# Patient Record
Sex: Male | Born: 1985 | Race: White | Hispanic: No | Marital: Single | Smoking: Never smoker
Health system: Southern US, Community
[De-identification: ages and names within clinical notes are randomized; demographics above are authoritative.]

## PROBLEM LIST (undated history)

## (undated) DIAGNOSIS — J302 Other seasonal allergic rhinitis: Secondary | ICD-10-CM

## (undated) DIAGNOSIS — K219 Gastro-esophageal reflux disease without esophagitis: Secondary | ICD-10-CM

## (undated) HISTORY — PX: TONSILLECTOMY: SUR1361

---

## 2019-07-12 ENCOUNTER — Emergency Department: Payer: 59

## 2019-07-12 ENCOUNTER — Other Ambulatory Visit: Payer: Self-pay

## 2019-07-12 ENCOUNTER — Encounter: Payer: Self-pay | Admitting: *Deleted

## 2019-07-12 ENCOUNTER — Emergency Department
Admission: EM | Admit: 2019-07-12 | Discharge: 2019-07-12 | Disposition: A | Discharge status: Home / Self Care | Payer: 59 | Attending: Emergency Medicine | Admitting: Emergency Medicine

## 2019-07-12 DIAGNOSIS — Y999 Unspecified external cause status: Secondary | ICD-10-CM | POA: Insufficient documentation

## 2019-07-12 DIAGNOSIS — Q7192 Unspecified reduction defect of left upper limb: Secondary | ICD-10-CM

## 2019-07-12 DIAGNOSIS — Y9351 Activity, roller skating (inline) and skateboarding: Secondary | ICD-10-CM | POA: Diagnosis not present

## 2019-07-12 DIAGNOSIS — Y929 Unspecified place or not applicable: Secondary | ICD-10-CM | POA: Insufficient documentation

## 2019-07-12 DIAGNOSIS — S52552A Other extraarticular fracture of lower end of left radius, initial encounter for closed fracture: Secondary | ICD-10-CM

## 2019-07-12 DIAGNOSIS — W19XXXA Unspecified fall, initial encounter: Secondary | ICD-10-CM

## 2019-07-12 DIAGNOSIS — S59912A Unspecified injury of left forearm, initial encounter: Secondary | ICD-10-CM | POA: Diagnosis present

## 2019-07-12 HISTORY — DX: Gastro-esophageal reflux disease without esophagitis: K21.9

## 2019-07-12 HISTORY — DX: Other seasonal allergic rhinitis: J30.2

## 2019-07-12 MED ORDER — DIAZEPAM 5 MG PO TABS
5.0000 mg | ORAL_TABLET | Freq: Once | ORAL | Status: AC
  Administered 2019-07-12: 5 mg via ORAL
  Filled 2019-07-12: qty 1

## 2019-07-12 MED ORDER — LIDOCAINE HCL (PF) 1 % IJ SOLN
10.0000 mL | Freq: Once | INTRAMUSCULAR | Status: AC
  Administered 2019-07-12: 10 mL
  Filled 2019-07-12: qty 10

## 2019-07-12 MED ORDER — KETOROLAC TROMETHAMINE 10 MG PO TABS: 10.0000 mg | ORAL_TABLET | Freq: Three times a day (TID) | ORAL | 0 refills | Status: AC

## 2019-07-12 MED ORDER — ONDANSETRON HCL 4 MG/2ML IJ SOLN
4.0000 mg | Freq: Once | INTRAMUSCULAR | Status: AC
  Administered 2019-07-12: 4 mg via INTRAVENOUS

## 2019-07-12 MED ORDER — HYDROCODONE-ACETAMINOPHEN 5-325 MG PO TABS: 1.0000 | ORAL_TABLET | Freq: Three times a day (TID) | ORAL | 0 refills | Status: AC | PRN

## 2019-07-12 MED ORDER — KETOROLAC TROMETHAMINE 30 MG/ML IJ SOLN
30.0000 mg | Freq: Once | INTRAMUSCULAR | Status: AC
  Administered 2019-07-12: 30 mg via INTRAVENOUS
  Filled 2019-07-12: qty 1

## 2019-07-12 MED ORDER — ONDANSETRON HCL 4 MG/2ML IJ SOLN
INTRAMUSCULAR | Status: AC
  Filled 2019-07-12: qty 2

## 2019-07-12 MED ORDER — HYDROMORPHONE HCL 1 MG/ML IJ SOLN
1.0000 mg | Freq: Once | INTRAMUSCULAR | Status: AC
  Administered 2019-07-12: 1 mg via INTRAVENOUS
  Filled 2019-07-12: qty 1

## 2019-07-12 NOTE — ED Provider Notes (Addendum)
[facility] Emergency Department Provider Note ____________________________________________  Time seen: 2100  I have reviewed the triage vital signs and the nursing notes.  HISTORY  Chief Complaint  Arm Injury   HPI Donald Morrow is a 34 y.o. male presents to the clinic today with c/o left wrist pain, swelling and disfigurement. He reports he was roller skating, started to fall and tried to catch himself with his left hand. He reports immediate sharp, stabbing, throbbing pain 10/10. He denies numbness or tingling in the left hand.   Past Medical History:  Diagnosis Date  . Acid reflux   . Seasonal allergies     There are no problems to display for this patient.    Prior to Admission medications   Medication Sig Start Date End Date Taking? Authorizing Provider  HYDROcodone-acetaminophen (NORCO/VICODIN) 5-325 MG tablet Take 1 tablet by mouth every 8 (eight) hours as needed for moderate pain. 07/12/19   Jearld Fenton, NP  ketorolac (TORADOL) 10 MG tablet Take 1 tablet (10 mg total) by mouth every 8 (eight) hours. 07/12/19   Jearld Fenton, NP    Allergies Patient has no known allergies.  No family history on file.  Social History Social History   Tobacco Use  . Smoking status: Never Smoker  . Smokeless tobacco: Never Used  Substance Use Topics  . Alcohol use: Yes    Comment: rarely, ETOH tonight  . 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 Cardiovascular: Negative for chest pain or chest tightness. Respiratory: Negative for cough or shortness of breath. Musculoskeletal: Positive for left wrist pain and swelling.  Skin: Negative for redness, abrasions or lacerations. Neurological: Negative for tingling or numbness. ____________________________________________  PHYSICAL EXAM:  VITAL SIGNS: ED Triage Vitals  Enc Vitals Group     BP 07/12/19 2036 124/88      Pulse Rate 07/12/19 2036 (!) 112     Resp 07/12/19 2036 (!) 22     Temp 07/12/19 2036 100.1 F (37.8 C)     Temp Source 07/12/19 2036 Oral     SpO2 07/12/19 2036 96 %     Weight 07/12/19 2037 210 lb (95.3 kg)     Height 07/12/19 2037 5\' 7"  (1.702 m)     Head Circumference --      Peak Flow --      Pain Score 07/12/19 2036 8     Pain Loc --      Pain Edu? --      Excl. in [place]? --     Constitutional: Alert and oriented. Obese, appears in pain. Head: Normocephalic and atraumatic. Eyes: Conjunctivae are normal. PERRL. Normal extraocular movements Cardiovascular: Tachycardic. Radial pulse 2+ bilaterally. Respiratory: Tachypneic. No wheezes/rales/rhonchi. Musculoskeletal: Obvious deformity of left wrist. Neurologic:  Sensation intact to left hand.  Skin:  Skin is warm, dry and intact.  ____________________________________________   RADIOLOGY  Imaging Orders     DG Forearm Left     DG Wrist Complete Left     DG Wrist 2 Views Left  ____________________________________________  PROCEDURES  Reduction of fracture  Date/Time: 07/12/2019 10:42 PM Performed by: Melvenia Needles, PA-C Authorized by: Jearld Fenton, NP  Consent: Verbal consent obtained. Consent given by: patient Patient understanding: patient states understanding of the procedure being performed Patient identity confirmed: verbally with patient Local anesthesia used: yes Anesthesia: hematoma block  Anesthesia: Local  anesthesia used: yes Local Anesthetic: lidocaine 1% without epinephrine Anesthetic total: 7 mL  Sedation: Patient sedated: no  Patient tolerance: patient tolerated the procedure well with no immediate complications    ____________________________________________  INITIAL IMPRESSION / ASSESSMENT AND PLAN / ED COURSE  Distal Comminuted Displaced Radial Fracture, Left:  Xray wrist Xray left forearm 1 mg Dilaudid IV x 1 Zofran 4 mg IV x 1 Toradol 30 mg IV x 1 Valium 5 mg PO x  1 D/w Dr. Franco Collet- ortho on call. Recommend reduction, splinting and follow up with ortho as an outpatient Post reduction xray wrist obtained- reduction successful RX for Toradol 10 mg PO TID with food RX for Hydrocodone 5-325 mg TID prn Placed in sling Follow up with ortho as outpatient     I reviewed the patient's prescription history over the last 12 months in the multi-state controlled substances database(s) that includes [place], [place], [place], [place], [place], [place], [place], [place], [place], [place], [place], [place], [place], and [place].  Results were notable for no recent controlled substances. ____________________________________________  FINAL CLINICAL IMPRESSION(S) / ED DIAGNOSES  Final diagnoses:  Reduction defect of left upper extremity  Fall, initial encounter  Other closed extra-articular fracture of distal end of left radius, initial encounter   Nicki Reaper, NP    Lorre Munroe, NP 07/12/19 2251    Lorre Munroe, NP 07/12/19 2259    Sharman Cheek, MD 07/12/19 2350

## 2019-07-12 NOTE — ED Notes (Signed)
Ice applied along side the left forearm. Patient couldn't tolerate it on the injury. Patient is calmer after the Dilaudid, though still diaphorectic. Patient is no longer swearing or yelling. Patient did not tolerate x-rays well.

## 2019-07-12 NOTE — ED Triage Notes (Signed)
Pt presents w/ L wrist/forearm injury that is displaced. Pt skating and fell catching self w/ L hand.

## 2019-07-12 NOTE — Discharge Instructions (Addendum)
You were seen today for left distal radius fracture. Your fracture was reduced and splinted. I have given you a RX for antiinflammatories and pain medication. I need you to follow up with ortho, please call to schedule this appt.

## 2021-10-03 IMAGING — DX DG WRIST 2V*L*
2 series · 2 of 2 positions shown · non-contrast
Comparison: 07/12/2019

CLINICAL DATA: Postreduction

EXAM:
LEFT WRIST - 2 VIEW

[wrist ap]
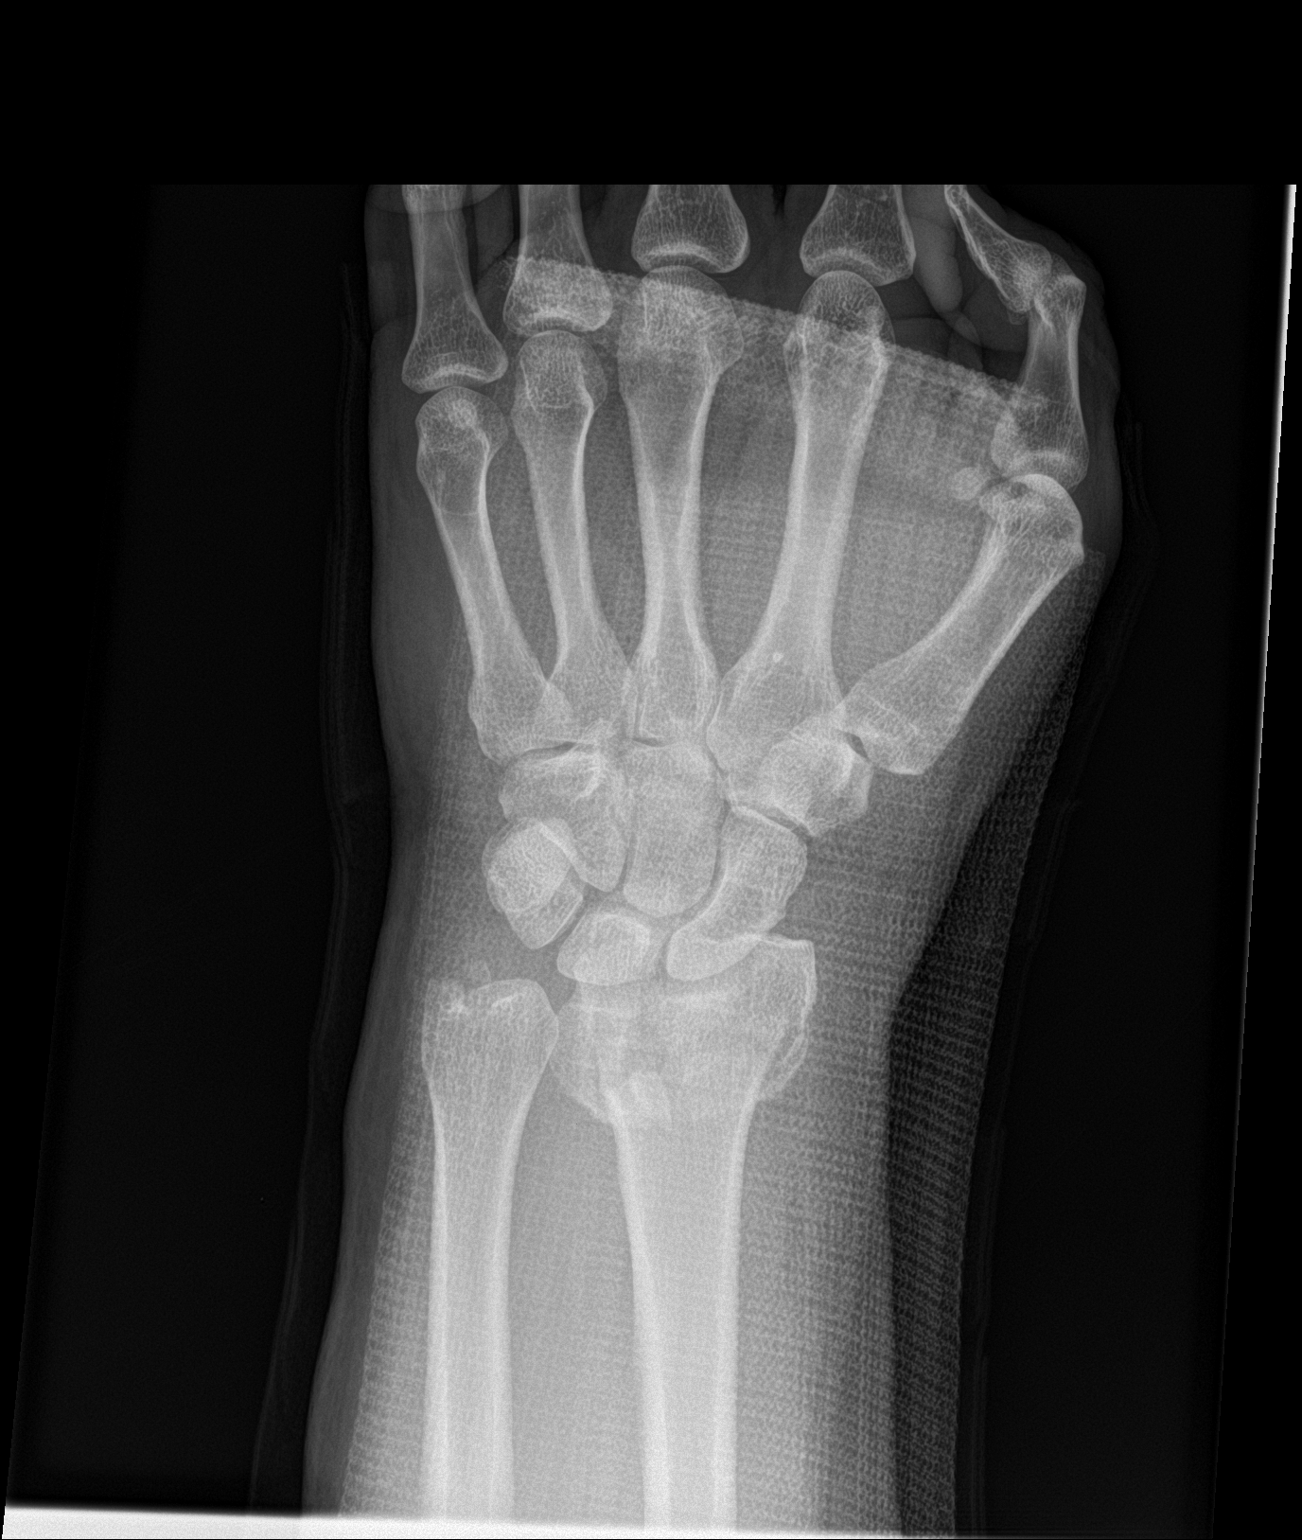

[wrist lat]
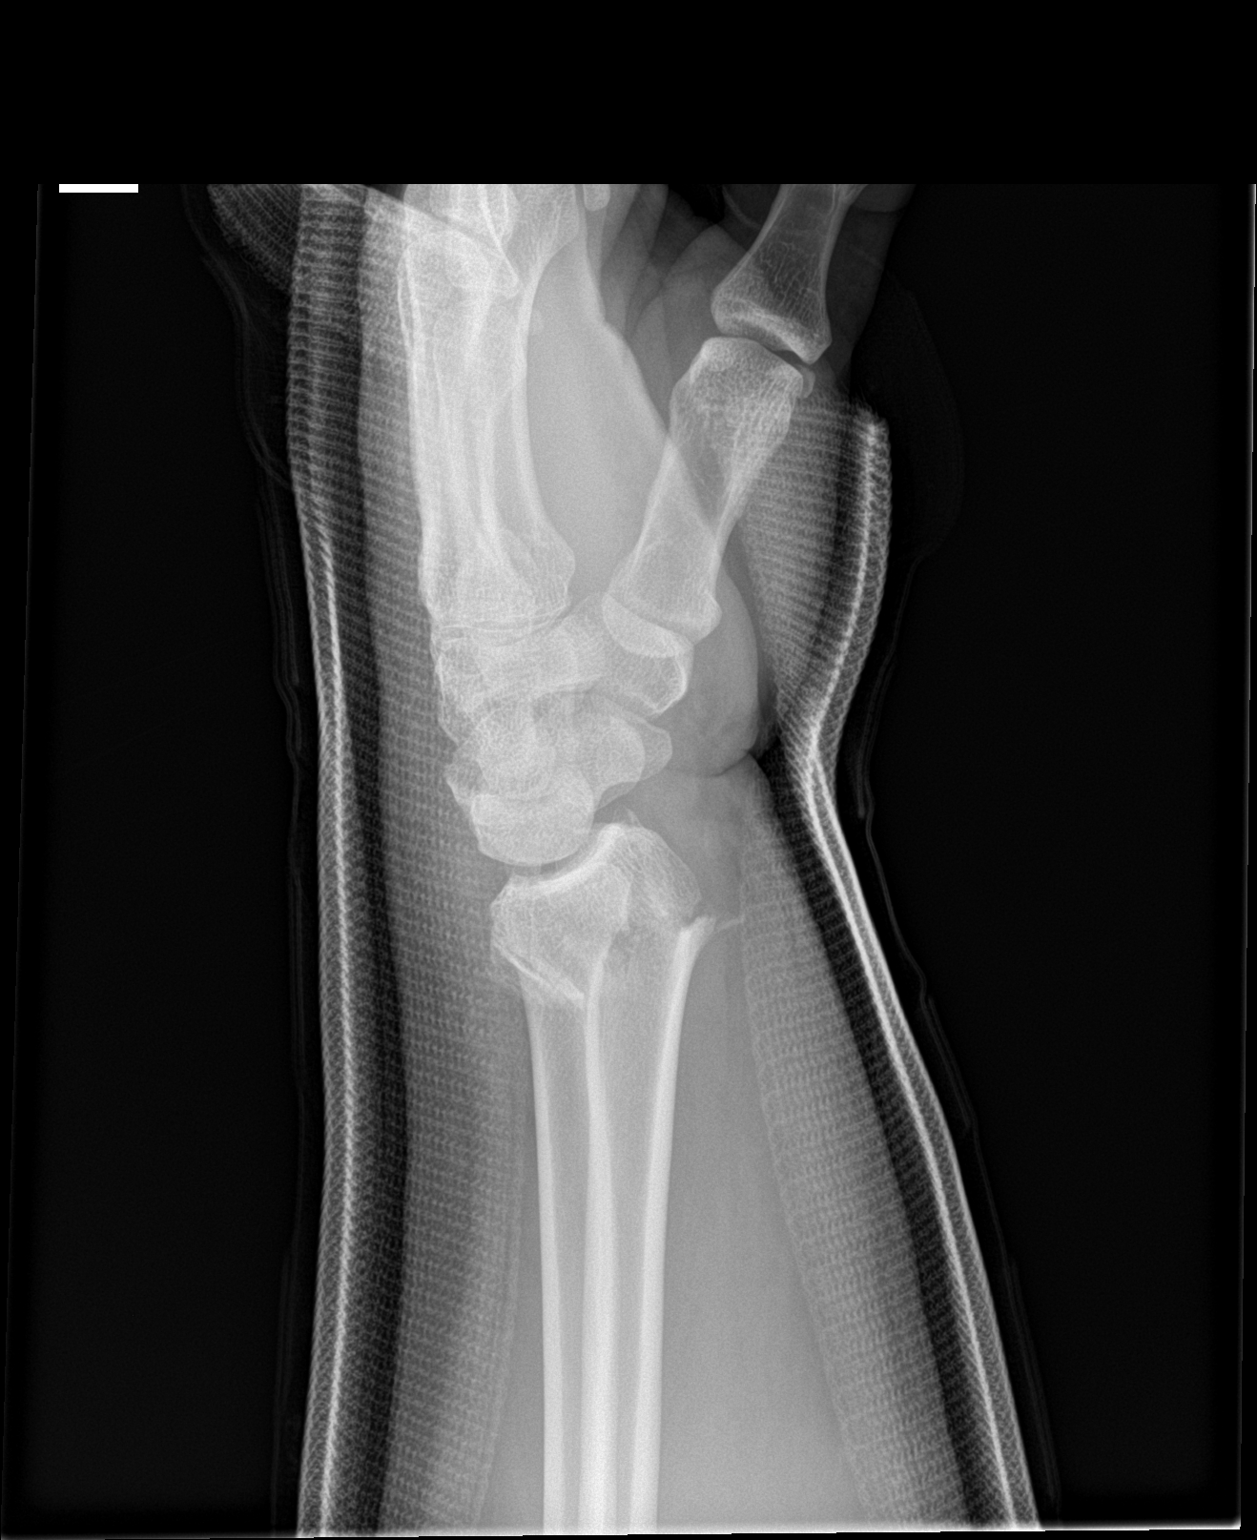

[2 of 2 positions shown; findings below may reference images not displayed]

FINDINGS: In cast views of the left wrist demonstrate continued displacement
of the distal left radial fracture posteriorly. Displaced ulnar
styloid fracture noted. No subluxation or dislocation.
IMPRESSION: No significant change in the alignment of the distal radial fracture
since prior study.

## 2021-10-03 IMAGING — DX DG WRIST COMPLETE 3+V*L*
3 series · 3 of 3 positions shown · non-contrast
Comparison: None.

CLINICAL DATA: Pain status post fall

EXAM:
LEFT WRIST - COMPLETE 3+ VIEW

[wrist obl]
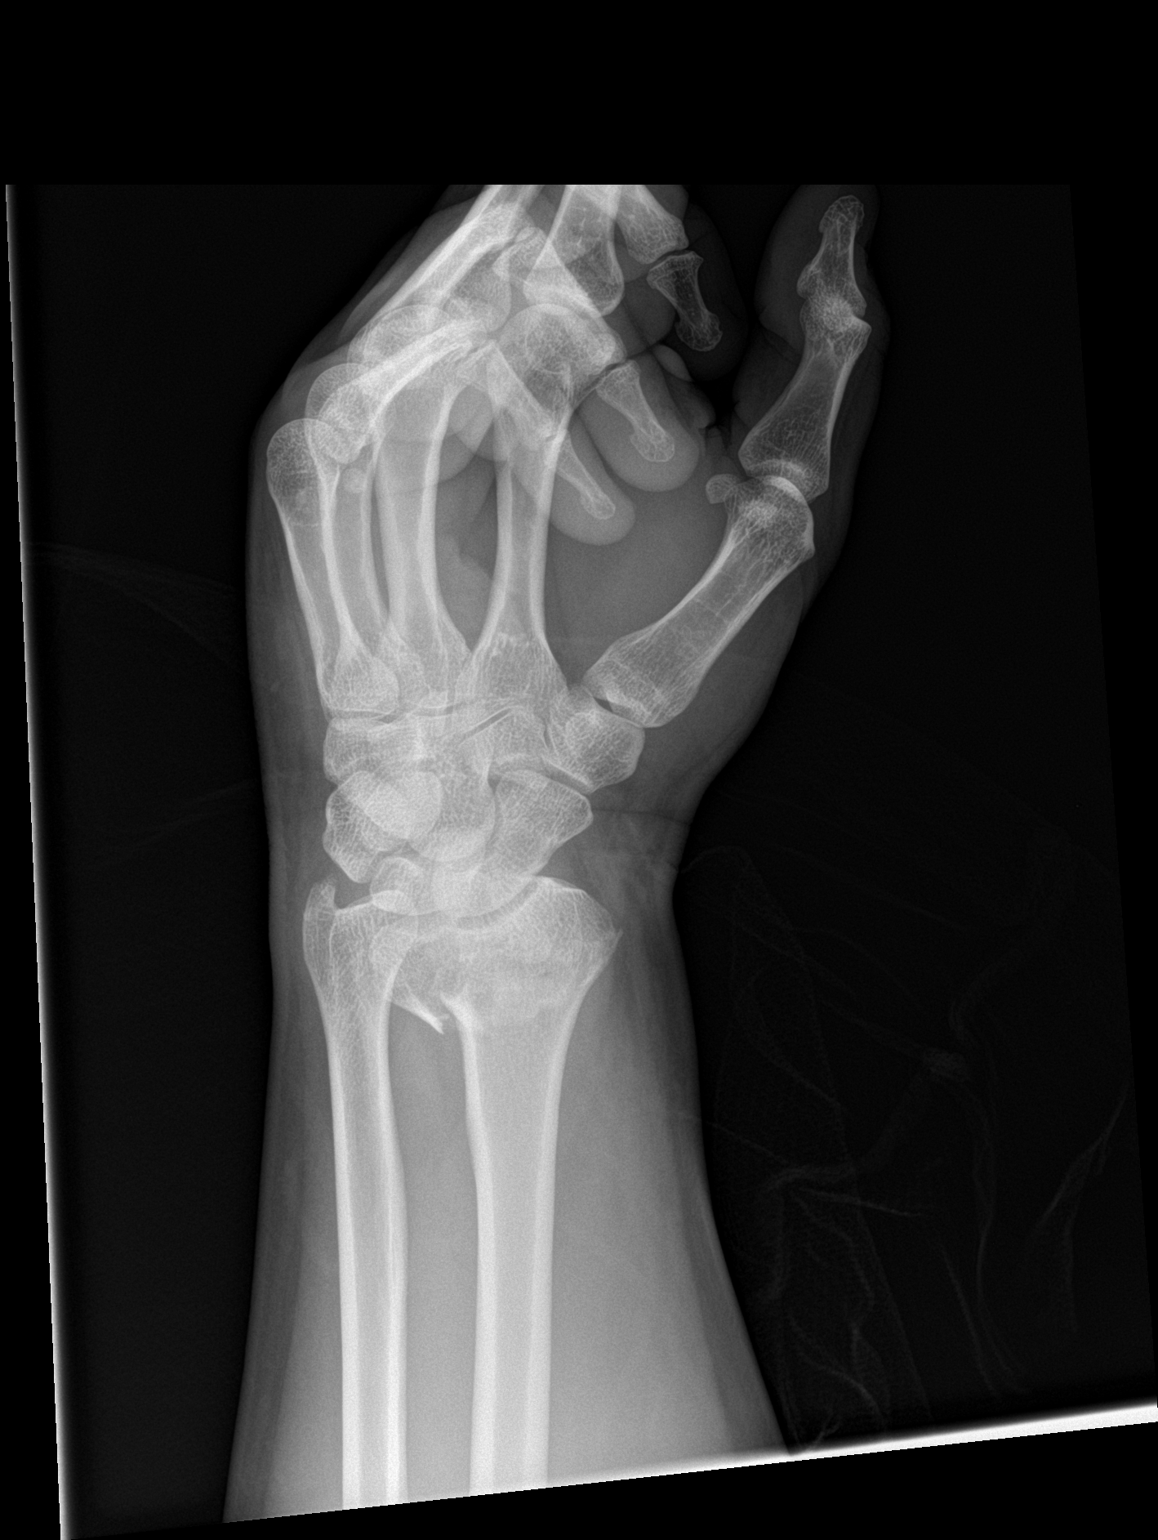

[wrist lat]
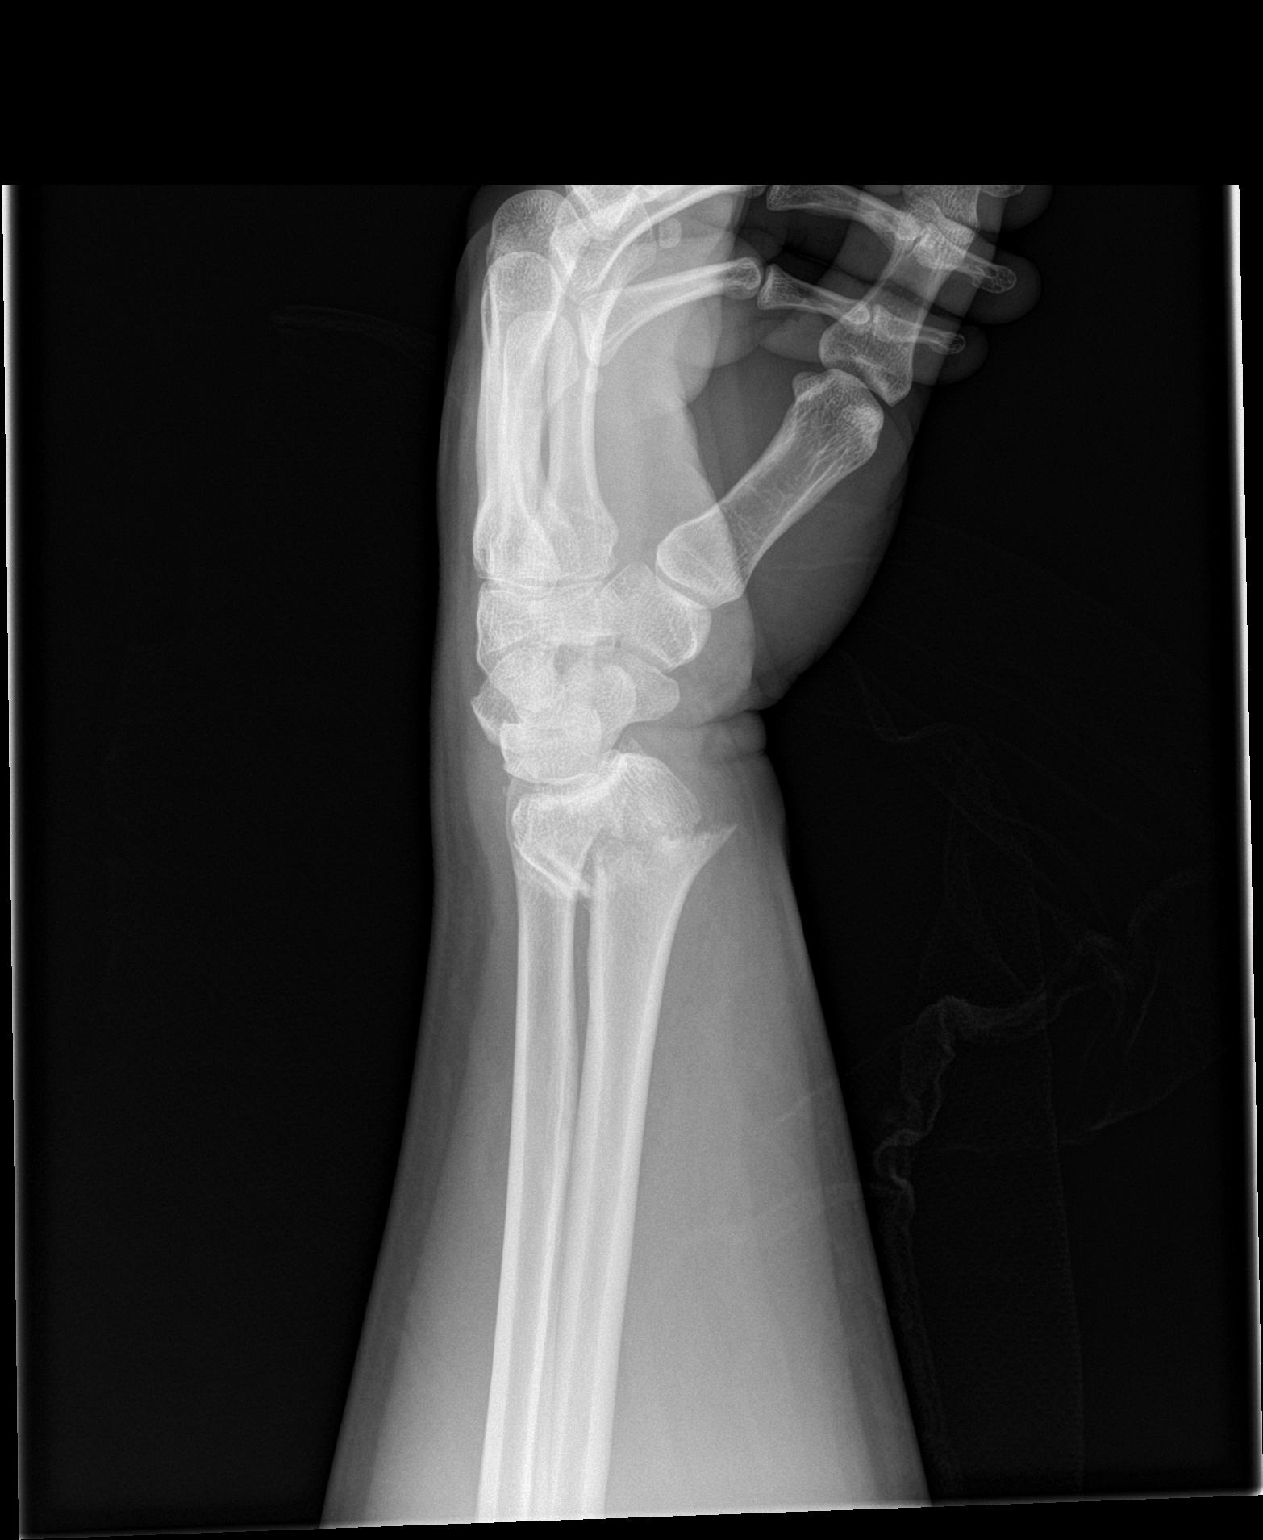

[wrist ap]
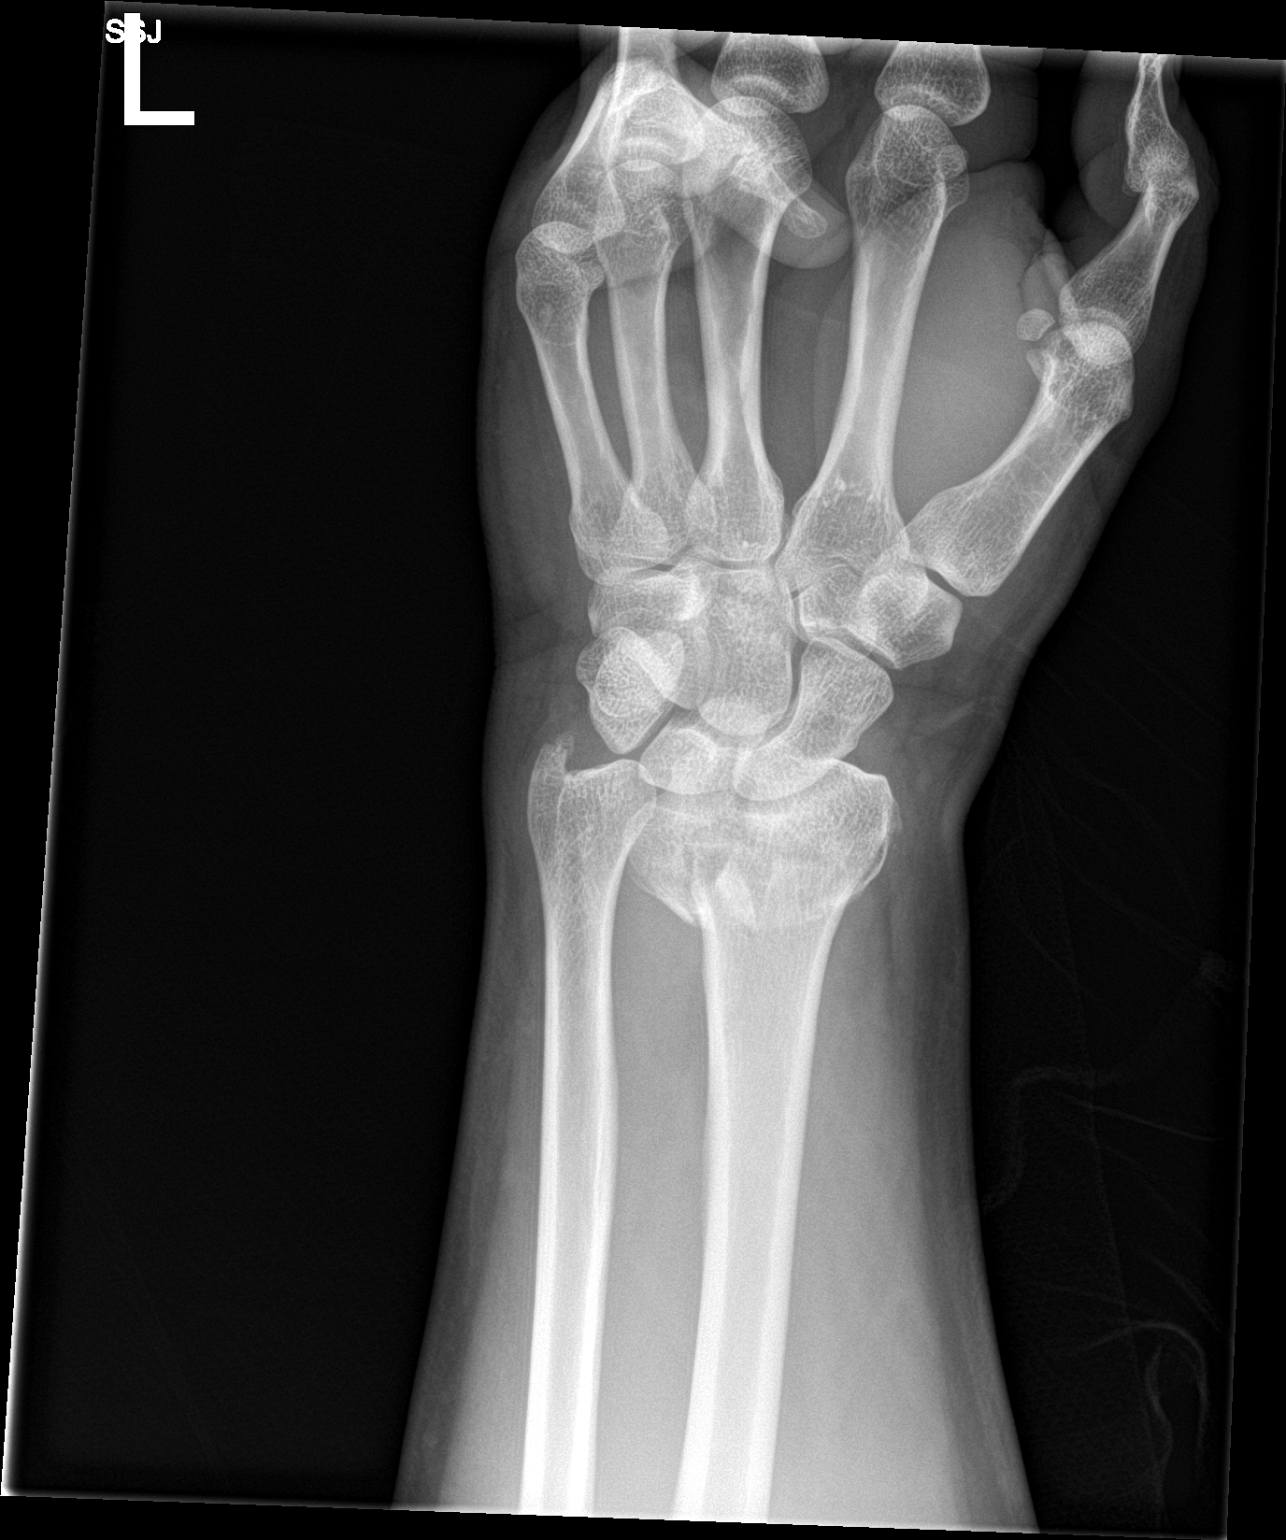

[3 of 3 positions shown; findings below may reference images not displayed]

FINDINGS: There is an acute comminuted, impacted and significantly displaced
fracture of the distal radius. There is a mildly displaced fracture
of the ulnar styloid process. There is extensive surrounding soft
tissue swelling. Evaluation limited by suboptimal lateral view.
IMPRESSION: Acute comminuted, impacted and significantly displaced fracture of
the distal radius.

Mildly displaced fracture of the ulnar styloid process. There is
surrounding soft tissue swelling.
